# Patient Record
Sex: Male | Born: 1960 | Race: White | Hispanic: No | Smoking: Current every day smoker
Health system: Southern US, Community
[De-identification: ages and names within clinical notes are randomized; demographics above are authoritative.]

## PROBLEM LIST (undated history)

## (undated) DIAGNOSIS — M542 Cervicalgia: Secondary | ICD-10-CM

## (undated) DIAGNOSIS — A6000 Herpesviral infection of urogenital system, unspecified: Secondary | ICD-10-CM

## (undated) DIAGNOSIS — J3501 Chronic tonsillitis: Secondary | ICD-10-CM

## (undated) DIAGNOSIS — M7580 Other shoulder lesions, unspecified shoulder: Secondary | ICD-10-CM

## (undated) DIAGNOSIS — M1990 Unspecified osteoarthritis, unspecified site: Secondary | ICD-10-CM

## (undated) DIAGNOSIS — N200 Calculus of kidney: Secondary | ICD-10-CM

## (undated) DIAGNOSIS — I639 Cerebral infarction, unspecified: Secondary | ICD-10-CM

## (undated) DIAGNOSIS — G459 Transient cerebral ischemic attack, unspecified: Secondary | ICD-10-CM

## (undated) HISTORY — DX: Transient cerebral ischemic attack, unspecified: G45.9

## (undated) HISTORY — DX: Calculus of kidney: N20.0

## (undated) HISTORY — PX: HERNIA REPAIR: SHX51

## (undated) HISTORY — DX: Other shoulder lesions, unspecified shoulder: M75.80

## (undated) HISTORY — PX: WISDOM TOOTH EXTRACTION: SHX21

## (undated) HISTORY — DX: Cerebral infarction, unspecified: I63.9

---

## 2006-11-12 ENCOUNTER — Encounter: Admission: RE | Admit: 2006-11-12 | Discharge: 2006-11-12 | Payer: Self-pay | Admitting: Orthopaedic Surgery

## 2007-03-08 ENCOUNTER — Emergency Department: Payer: Self-pay | Admitting: Emergency Medicine

## 2011-03-14 ENCOUNTER — Ambulatory Visit: Payer: Self-pay | Admitting: Surgery

## 2012-02-05 IMAGING — CT CT ABD-PELV W/ CM
1 of 4 series · 15 of 32 positions shown, 19 images · non-contrast
Comparison: none

REASON FOR EXAM: recurrent hernia Right side
COMMENTS:

PROCEDURE:     CT  - CT ABDOMEN / PELVIS  W  - March 14, 2011  [DATE]
RESULT:
TECHNIQUE: Helical 3 mm sections were obtained from the lung bases through
the pubic symphysis.

[Series 2: soft tissue · axial · 0.81mm/px · z∈[-540,-76]mm · 15 of 171 slices shown, 19 images]
[im 8/171  soft-tissue]
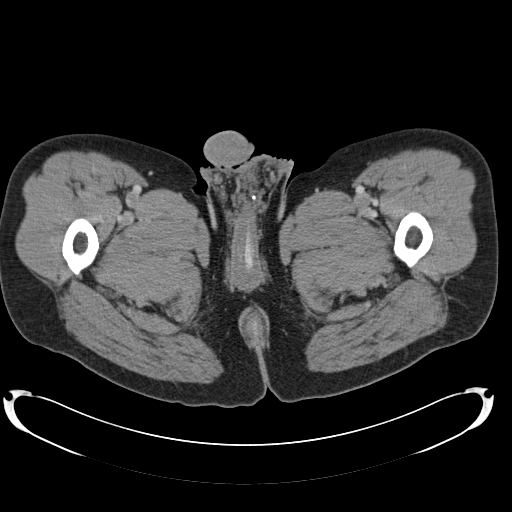
[im 8/171  bone]
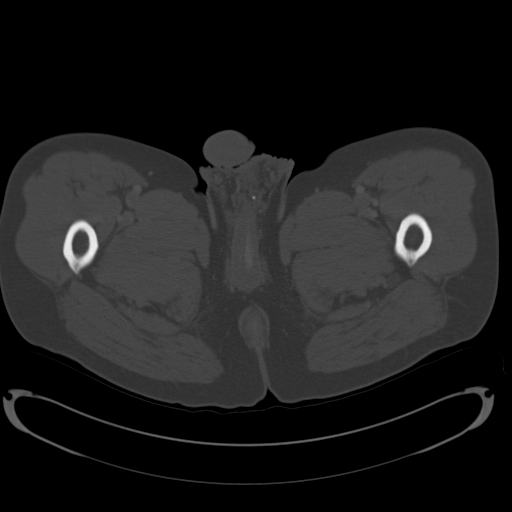
[im 24/171  soft-tissue]
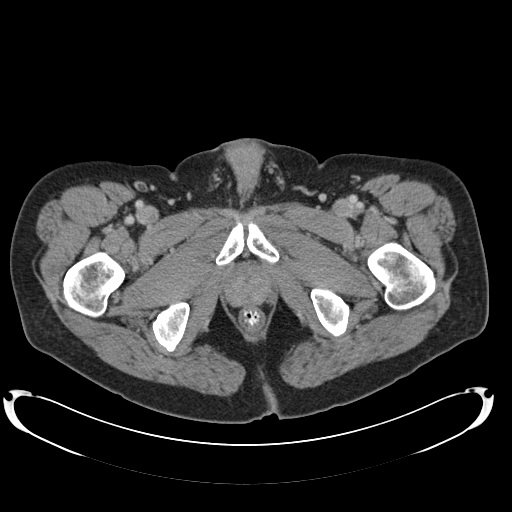
[im 39/171  soft-tissue]
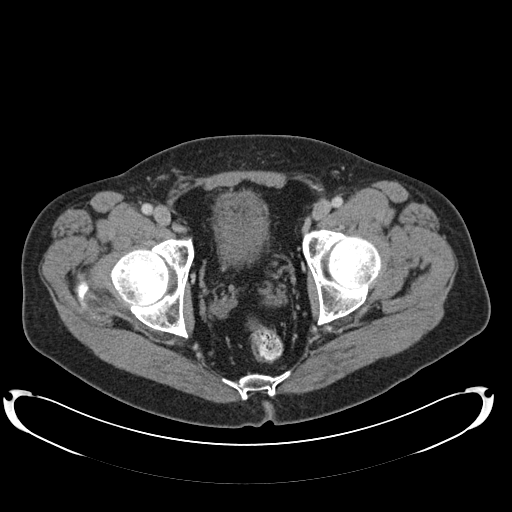
[im 47/171  soft-tissue]
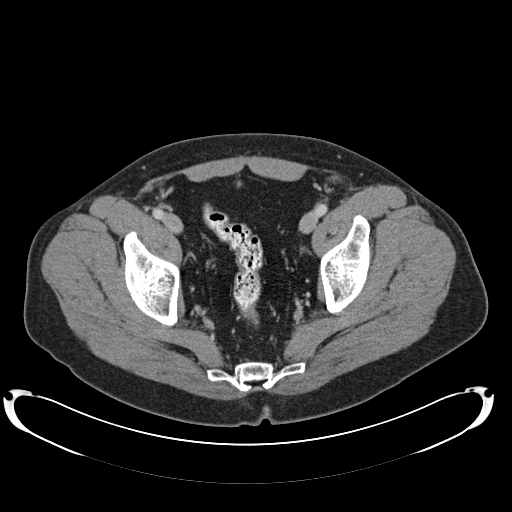
[im 62/171  soft-tissue]
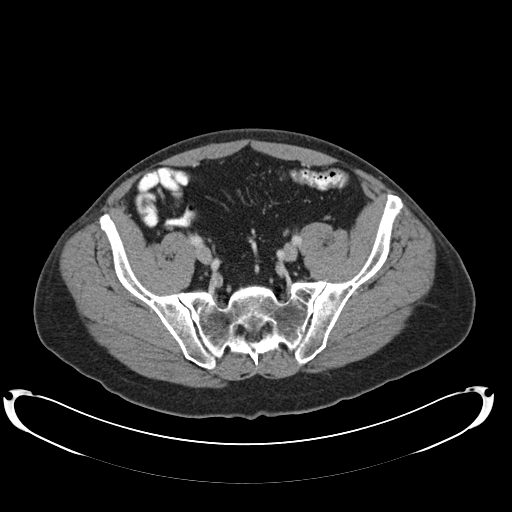
[im 70/171  soft-tissue]
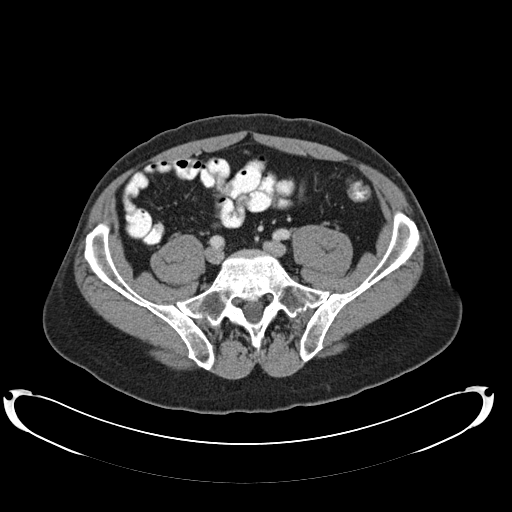
[im 86/171  soft-tissue]
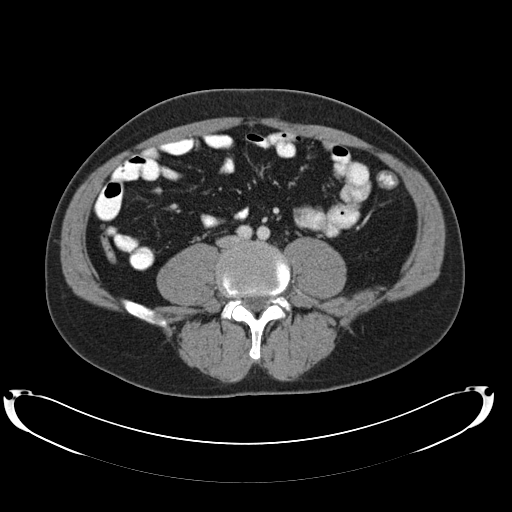
[im 101/171  soft-tissue]
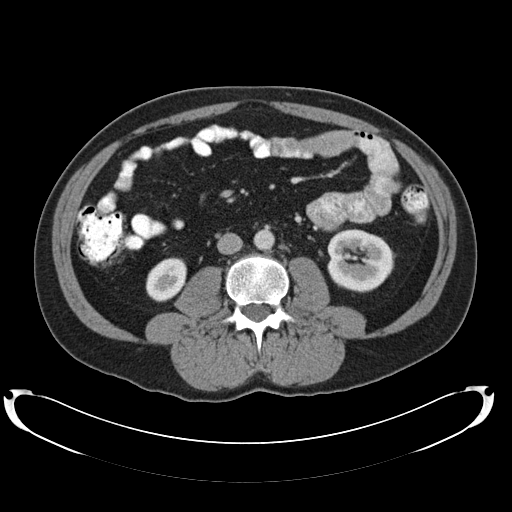
[im 109/171  soft-tissue]
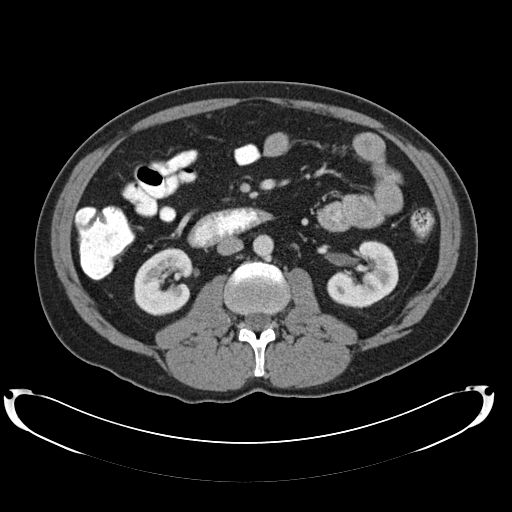
[im 109/171  bone]
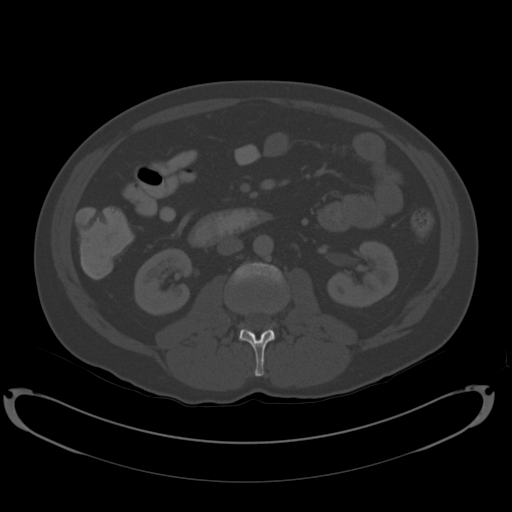
[im 124/171  soft-tissue]
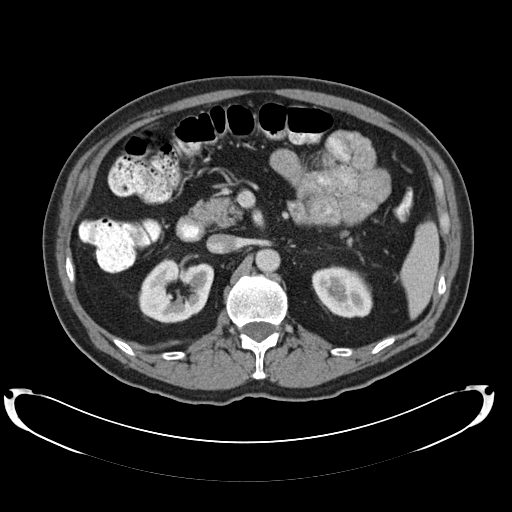
[im 132/171  soft-tissue]
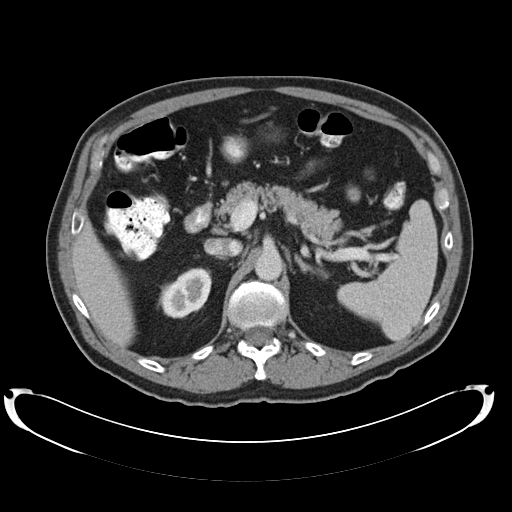
[im 140/171  lung]
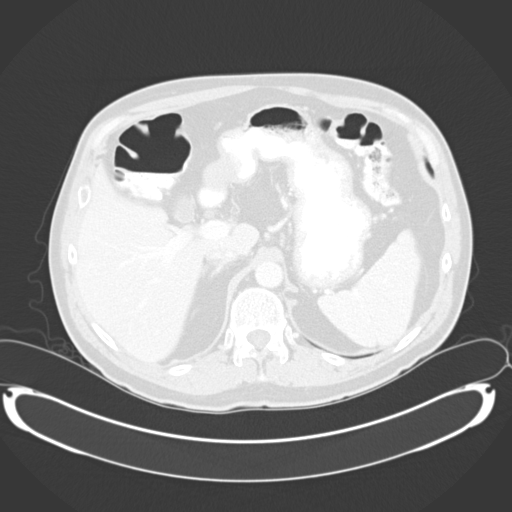
[im 147/171  soft-tissue]
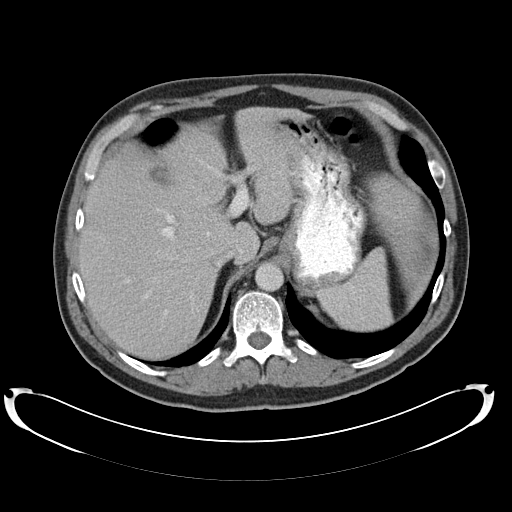
[im 147/171  lung]
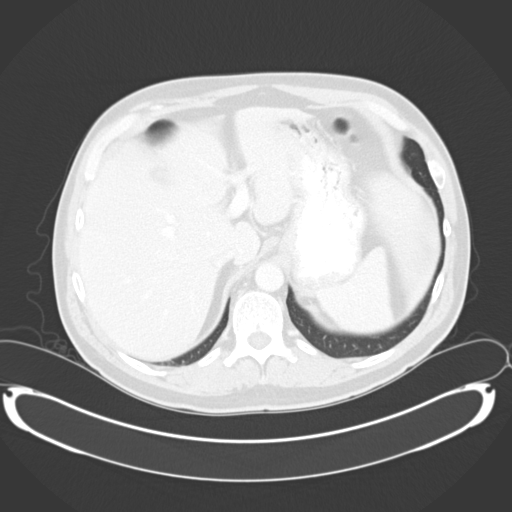
[im 155/171  lung]
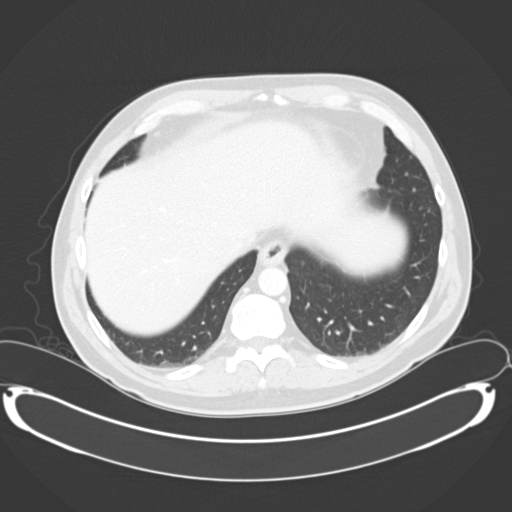
[im 163/171  soft-tissue]
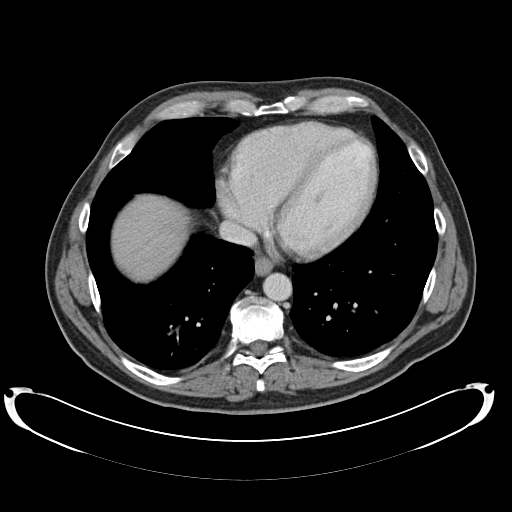
[im 163/171  lung]
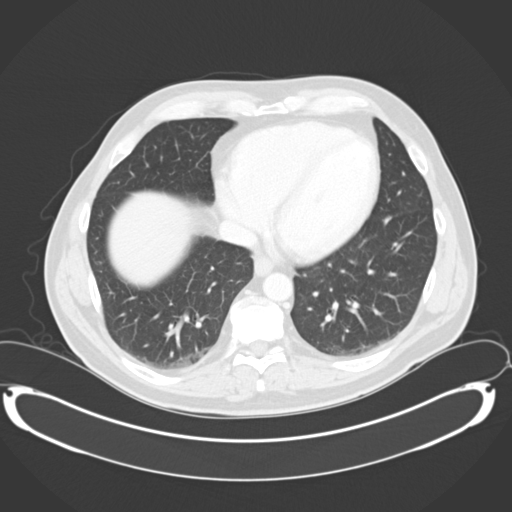

[15 of 32 positions shown; findings below may reference images not displayed]

FINDINGS: Hypoventilation is appreciated within the lung bases.

The liver, spleen, adrenals, and pancreas is unremarkable. The kidneys
demonstrate no evidence of hydronephrosis, hydroureter or evidence of
nephrolithiasis or ureterolithiasis. There is no evidence of abdominal or
pelvic free fluid, loculated fluid collections, masses or adenopathy. There
is no evidence of an abdominal aortic aneurysm. The celiac, SMA, IMA, and
portal vein are opacified.

There is no evidence of an abdominal wall or inguinal hernia.
IMPRESSION: 1.  No CT evidence of an abdominal wall or inguinal hernia.
2.  Otherwise, no evidence of obstructive or inflammatory abnormalities. The
appendix is identified and is unremarkable.

## 2013-03-10 ENCOUNTER — Emergency Department: Payer: Self-pay | Admitting: Emergency Medicine

## 2013-03-10 LAB — BASIC METABOLIC PANEL
Anion Gap: 4 — ABNORMAL LOW (ref 7–16)
BUN: 10 mg/dL (ref 7–18)
Calcium, Total: 9.1 mg/dL (ref 8.5–10.1)
Creatinine: 1.08 mg/dL (ref 0.60–1.30)
EGFR (African American): >60
EGFR (Non-African Amer.): >60
Osmolality: 275 (ref 275–301)

## 2013-03-10 LAB — CBC
MCHC: 34.1 g/dL (ref 32.0–36.0)
MCV: 81 fL (ref 80–100)
Platelet: 214 10*3/uL (ref 150–440)
RBC: 5.68 10*6/uL (ref 4.40–5.90)
RDW: 13.5 % (ref 11.5–14.5)

## 2013-03-10 LAB — TROPONIN I
Troponin-I: <0.02 ng/mL
Troponin-I: <0.02 ng/mL

## 2013-03-23 ENCOUNTER — Encounter: Payer: Self-pay | Admitting: Cardiovascular Disease

## 2013-03-23 ENCOUNTER — Ambulatory Visit (INDEPENDENT_AMBULATORY_CARE_PROVIDER_SITE_OTHER): Payer: Federal, State, Local not specified - PPO | Admitting: Cardiovascular Disease

## 2013-03-23 VITALS — BP 120/86 | HR 60 | Ht 70.0 in | Wt 195.5 lb

## 2013-03-23 DIAGNOSIS — R079 Chest pain, unspecified: Secondary | ICD-10-CM

## 2013-03-23 DIAGNOSIS — R0602 Shortness of breath: Secondary | ICD-10-CM

## 2013-03-23 DIAGNOSIS — R0789 Other chest pain: Secondary | ICD-10-CM | POA: Insufficient documentation

## 2013-03-23 MED ORDER — ASPIRIN EC 81 MG PO TBEC: 81.0000 mg | DELAYED_RELEASE_TABLET | Freq: Every day | ORAL | Status: DC

## 2013-03-23 NOTE — Patient Instructions (Addendum)
  Your physician has recommended you make the following change in your medication: START ASPIRIN 81 MG DAILY  Your physician recommends that you schedule a follow-up appointment in: 1 MONTH     [facility] MYOVIEW  Your caregiver has ordered a Stress Test with nuclear imaging. The purpose of this test is to evaluate the blood supply to your heart muscle. This procedure is referred to as a "Non-Invasive Stress Test." This is because other than having an IV started in your vein, nothing is inserted or "invades" your body. Cardiac stress tests are done to find areas of poor blood flow to the heart by determining the extent of coronary artery disease (CAD). Some patients exercise on a treadmill, which naturally increases the blood flow to your heart, while others who are  unable to walk on a treadmill due to physical limitations have a pharmacologic/chemical stress agent called Lexiscan . This medicine will mimic walking on a treadmill by temporarily increasing your coronary blood flow.   Please note: these test may take anywhere between 2-4 hours to complete  PLEASE REPORT TO [facility] MEDICAL MALL ENTRANCE  THE VOLUNTEERS AT THE FIRST DESK WILL DIRECT YOU WHERE TO GO  Date of Procedure:_______________Friday, Aug 29______________________  Arrival Time for Procedure:_________7:15_____________________      PLEASE NOTIFY THE OFFICE AT LEAST 24 HOURS IN ADVANCE IF YOU ARE UNABLE TO KEEP YOUR APPOINTMENT.  [phone] AND  PLEASE NOTIFY NUCLEAR MEDICINE AT [facility] AT LEAST 24 HOURS IN ADVANCE IF YOU ARE UNABLE TO KEEP YOUR APPOINTMENT. [phone]  How to prepare for your Myoview test:  1. Do not eat or drink after midnight 2. No caffeine for 24 hours prior to test 3. No smoking 24 hours prior to test. 4. Your medication may be taken with water.  If your doctor stopped a medication because of this test, do not take that medication. 5. Ladies, please do not wear dresses.  Skirts or pants are appropriate.  Please wear a short sleeve shirt. 6. No perfume, cologne or lotion. 7. Wear comfortable walking shoes. No heels!

## 2013-03-23 NOTE — Assessment & Plan Note (Addendum)
Shawn Wiggins presents today for followup of an episode of chest discomfort. His EKG shows NSR and  possible anterior  wall myocardial infarction and  some ST/T-wave changes in the inferior and lateral leads.  Our best option is to proceed with a Lexiscan Myoivew study.  I strongly advised him to stop smoking. We reviewed the various approaches to smoking cessation. I seen again in a month or so following the stress test.  We will start him on ASA 81 mg a day.   We will see him sooner if the stress test is abnormal.

## 2013-03-23 NOTE — Progress Notes (Signed)
     Gardenia Phlegm Date of Birth  [birth year]       [facility] Office [address]  [address] [place], [place]  [address]   [place], [place]  [address] [phone]     [phone]   Fax  [phone]    Fax [phone]  Problem List: 1.  chest discomfort 2.  History of Present Illness:  Shawn Wiggins is a 70 who  Seen today for some chest discomfort.  He complains of stinging in his chest. The pain started spontaneously. It lasted for several hours. It was centered in his chest and felt like a stinging sensation. It eventually resolved when he went to sleep.  He went to the emergency room the following day and eventually felt better later that night.  Cardiac enzymes were negative x2. His chest x-ray was unremarkable.  Since then he has felt ok.  He has had occaional heart flutters.  He has not been exercising as much as he would like.  He works as a Curator for [employer].  He still smokes 1 1/2 packs of cigarettes.  Wants to quit.  No current outpatient prescriptions on file prior to visit.   No current facility-administered medications on file prior to visit.    No Known Allergies  Past Medical History  Diagnosis Date  . AC (acromioclavicular) joint bone spurs   . Mini stroke   . Kidney stones     Past Surgical History  Procedure Laterality Date  . Hernia repair      History  Smoking status  . Current Every Day Smoker -- 1.00 packs/day for 30 years  . Types: Cigarettes  Smokeless tobacco  . Not on file    History  Alcohol Use No    Family History  Problem Relation Age of Onset  . Family history unknown: Yes    Reviw of Systems:  Reviewed in the HPI.  All other systems are negative.  Physical Exam: Blood pressure 120/86, pulse 60, height 5\' 10"  (1.778 m), weight 195 lb 8 oz (88.678 kg). General: Well developed, well nourished, in no acute distress.  Head: Normocephalic, atraumatic, sclera non-icteric, mucus  membranes are moist,   Neck: Supple. Carotids are 2 + without bruits. No JVD   Lungs: Clear   Heart: RR, normal S1, S2. I  Abdomen: Soft, non-tender, non-distended with normal bowel sounds.  Msk:  Strength and tone are normal   Extremities: No clubbing or cyanosis. No edema.  Distal pedal pulses are 2+ and equal    Neuro: CN II - XII intact.  Alert and oriented X 3.   Psych:  Normal   ECG: March 23, 2013:  NSR at 63, poor R wave progression, possible anterior MI , Inferior and lateral ST/T abnormalities.  Assessment / Plan:

## 2013-03-25 ENCOUNTER — Ambulatory Visit: Payer: Self-pay

## 2013-03-25 DIAGNOSIS — R079 Chest pain, unspecified: Secondary | ICD-10-CM

## 2013-03-29 ENCOUNTER — Other Ambulatory Visit: Payer: Self-pay

## 2013-03-29 DIAGNOSIS — R079 Chest pain, unspecified: Secondary | ICD-10-CM

## 2013-03-29 DIAGNOSIS — R0602 Shortness of breath: Secondary | ICD-10-CM

## 2013-03-31 ENCOUNTER — Telehealth: Payer: Self-pay

## 2013-03-31 NOTE — Telephone Encounter (Signed)
Message copied by Marilynne Halsted on Thu Mar 31, 2013  4:39 PM ------      Message from: Vesta Mixer      Created: Tue Mar 29, 2013 12:06 PM       Low risk myoview study.      Keep apt. ------

## 2013-03-31 NOTE — Telephone Encounter (Signed)
Spoke w/ pt.  He is aware of results and will keep appt on 04/28/13.

## 2013-04-01 ENCOUNTER — Ambulatory Visit: Payer: Self-pay | Admitting: Cardiovascular Disease

## 2013-04-28 ENCOUNTER — Ambulatory Visit (INDEPENDENT_AMBULATORY_CARE_PROVIDER_SITE_OTHER): Payer: Federal, State, Local not specified - PPO | Admitting: Cardiovascular Disease

## 2013-04-28 ENCOUNTER — Encounter: Payer: Self-pay | Admitting: Cardiovascular Disease

## 2013-04-28 VITALS — BP 126/86 | HR 77 | Ht 70.0 in | Wt 198.0 lb

## 2013-04-28 DIAGNOSIS — R0789 Other chest pain: Secondary | ICD-10-CM

## 2013-04-28 NOTE — Assessment & Plan Note (Addendum)
He is doing well.  He is interested in quitting smoking.  We discussed the smoking cessation classes at [facility] and he wrote the number down.  We will not schedule him for a return apt but will be glad to see him if needed.

## 2013-04-28 NOTE — Progress Notes (Signed)
     Gardenia Phlegm Date of Birth  February 09, 1961       [facility] Office [address]  [address] [place], [place]  [address]   [place], [place]  [address] [phone]     [phone]   Fax  [phone]    Fax [phone]  Problem List: 1.  chest discomfort 2.  History of Present Illness:  Kavontae is a 89 who  Seen today for some chest discomfort.  He complains of stinging in his chest. The pain started spontaneously. It lasted for several hours. It was centered in his chest and felt like a stinging sensation. It eventually resolved when he went to sleep.  He went to the emergency room the following day and eventually felt better later that night.  Cardiac enzymes were negative x2. His chest x-ray was unremarkable.  Since then he has felt ok.  He has had occaional heart flutters.  He has not been exercising as much as he would like.  He works as a Curator for [employer].  He still smokes 1 1/2 packs of cigarettes.  Wants to quit.  Oct. 2, 2014:  Eleanor is doing ok.. he presented with some episodes of chest discomfort. His stress Myoview study performed at [facility] was low risk.  He is no longer having any chest pain.  He is still smoking.  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 .    Current Outpatient Prescriptions on File Prior to Visit  Medication Sig Dispense Refill  . aspirin EC 81 MG tablet Take 1 tablet (81 mg total) by mouth daily.  90 tablet  3   No current facility-administered medications on file prior to visit.    No Known Allergies  Past Medical History  Diagnosis Date  . AC (acromioclavicular) joint bone spurs   . Mini stroke   . Kidney stones     Past Surgical History  Procedure Laterality Date  . Hernia repair      History  Smoking status  . Current Every Day Smoker -- 1.00 packs/day for 30 years  . Types: Cigarettes  Smokeless tobacco  . Not on file    History  Alcohol Use No    No family history on  file.  Reviw of Systems:  Reviewed in the HPI.  All other systems are negative.  Physical Exam: Blood pressure 126/86, pulse 77, height 5\' 10"  (1.778 m), weight 198 lb (89.812 kg), SpO2 96.00%. General: Well developed, well nourished, in no acute distress.  Head: Normocephalic, atraumatic, sclera non-icteric, mucus membranes are moist,   Neck: Supple. Carotids are 2 + without bruits. No JVD   Lungs: Clear   Heart: RR, normal S1, S2. I  Abdomen: Soft, non-tender, non-distended with normal bowel sounds.  Msk:  Strength and tone are normal   Extremities: No clubbing or cyanosis. No edema.  Distal pedal pulses are 2+ and equal    Neuro: CN II - XII intact.  Alert and oriented X 3.   Psych:  Normal   ECG: March 23, 2013:  NSR at 63, poor R wave progression, possible anterior MI , Inferior and lateral ST/T abnormalities.  Assessment / Plan:

## 2013-04-28 NOTE — Patient Instructions (Addendum)
Your physician recommends that you schedule a follow-up appointment in: AS NEEDED BASIS  

## 2013-05-31 ENCOUNTER — Encounter: Payer: Self-pay | Admitting: *Deleted

## 2014-02-01 IMAGING — CR DG CHEST 2V
1 series · 2 of 2 positions shown · non-contrast
Comparison: none

REASON FOR EXAM: Chest Pain
COMMENTS:

PROCEDURE:     DXR - DXR CHEST PA (OR AP) AND LATERAL  - March 10, 2013  [DATE]
RESULT:     Comparison: None

[Series 1: w chest pa · 0.14mm/px · 2 of 2 slices shown]
[im 1/2]
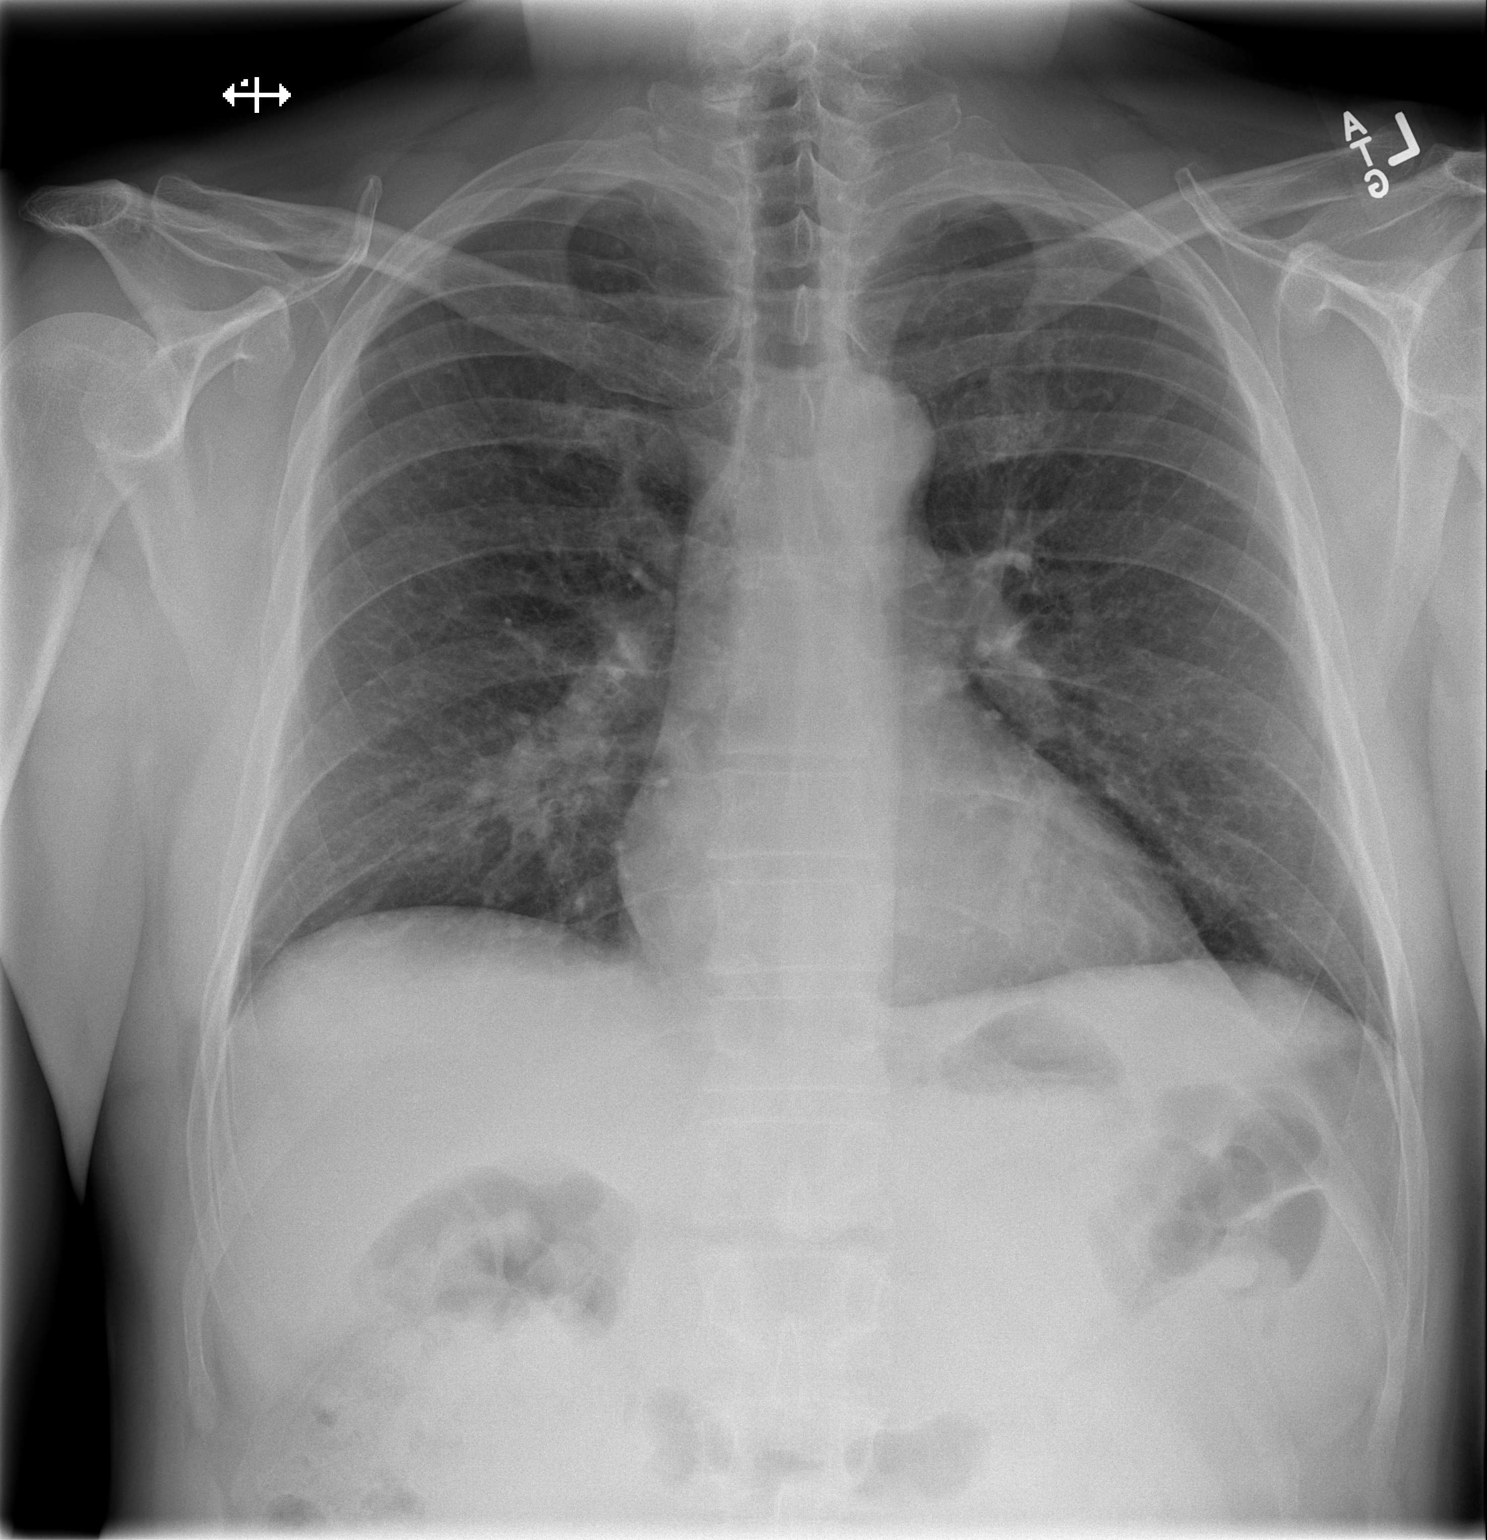
[im 2/2]
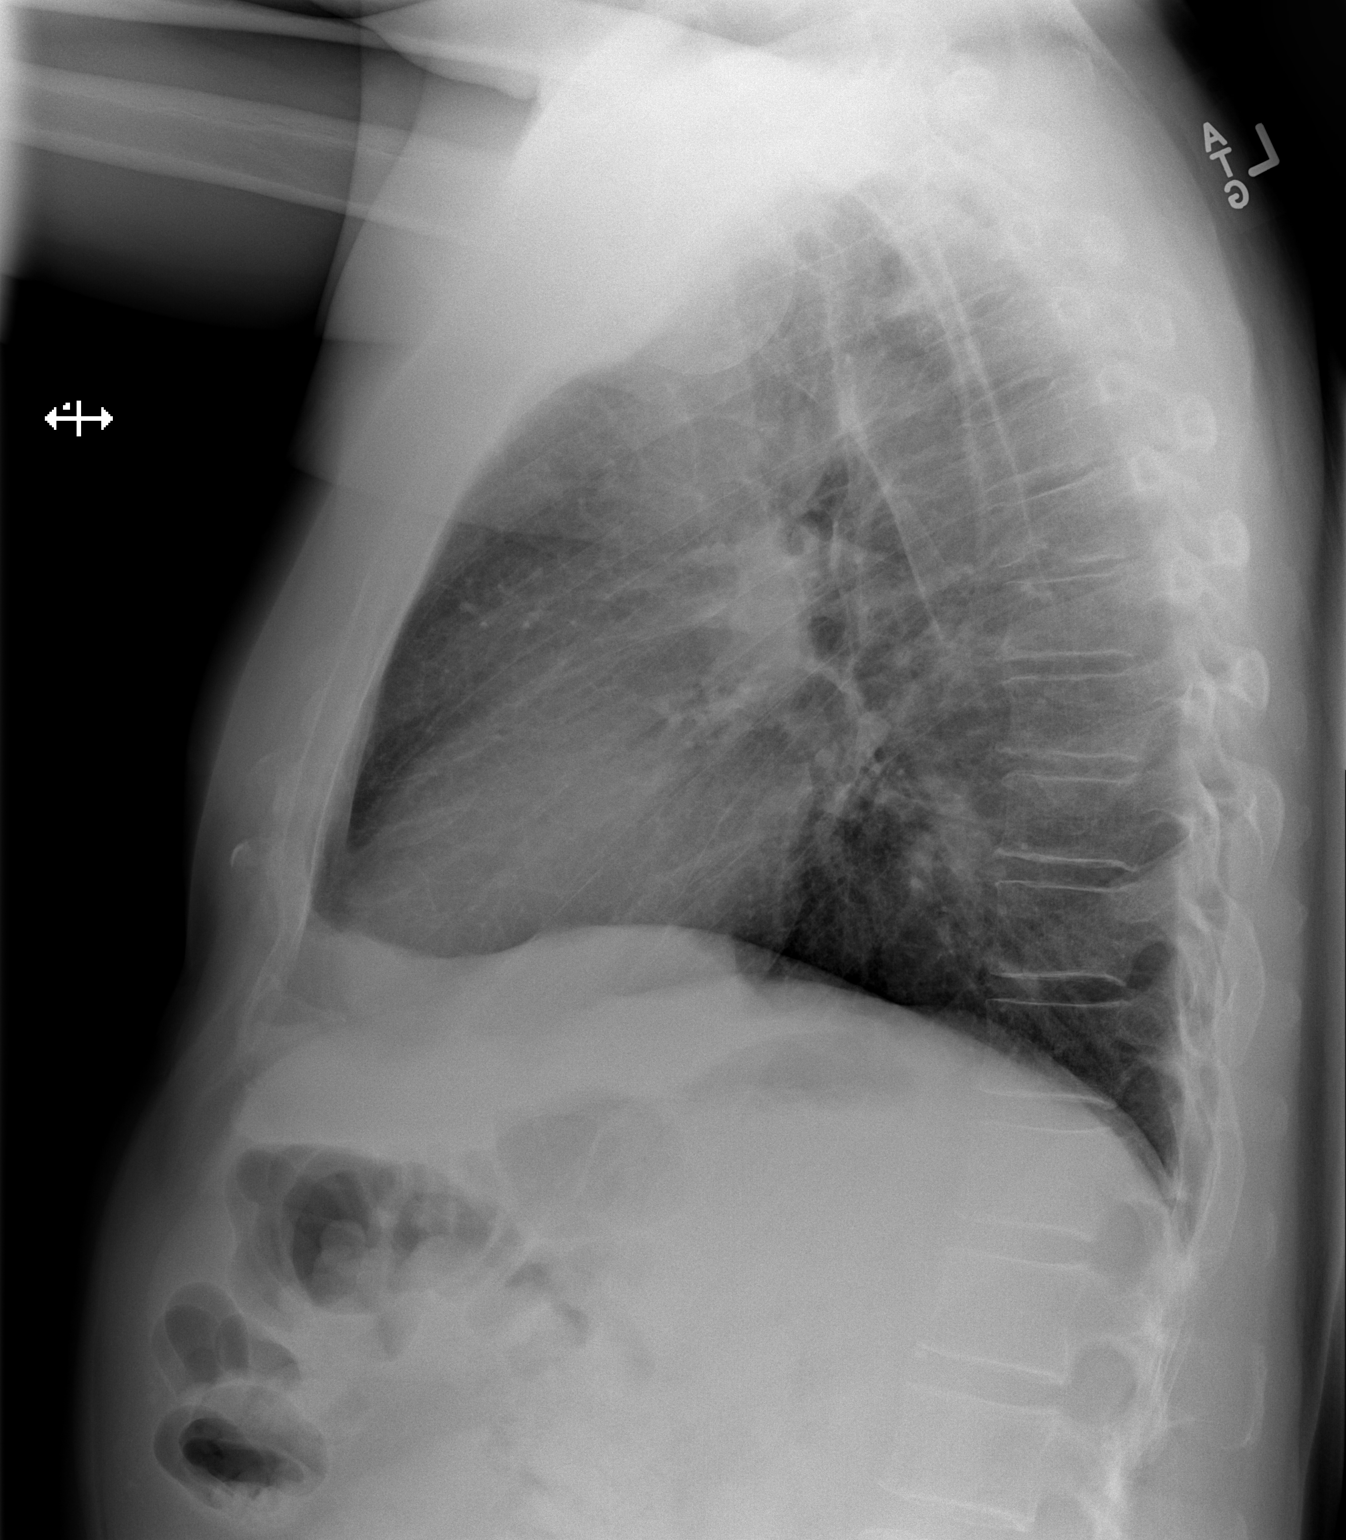

[2 of 2 positions shown; findings below may reference images not displayed]

FINDINGS: PA and lateral chest radiographs are provided.  There is no focal
parenchymal opacity, pleural effusion, or pneumothorax. The heart and
mediastinum are unremarkable.  The osseous structures are unremarkable.
IMPRESSION: No acute disease of the che[REDACTED]

## 2015-03-21 ENCOUNTER — Encounter: Payer: Self-pay | Admitting: *Deleted

## 2015-03-22 ENCOUNTER — Ambulatory Visit: Payer: Federal, State, Local not specified - PPO | Admitting: Anesthesiology

## 2015-03-22 ENCOUNTER — Encounter: Payer: Self-pay | Admitting: *Deleted

## 2015-03-22 ENCOUNTER — Encounter
Admission: RE | Disposition: A | Discharge status: Home / Self Care | Payer: Self-pay | Source: Ambulatory Visit | Attending: Gastroenterology

## 2015-03-22 ENCOUNTER — Ambulatory Visit
Admission: RE | Admit: 2015-03-22 | Discharge: 2015-03-22 | Disposition: A | Discharge status: Home / Self Care | Payer: Federal, State, Local not specified - PPO | Source: Ambulatory Visit | Attending: Gastroenterology | Admitting: Gastroenterology

## 2015-03-22 DIAGNOSIS — Z87442 Personal history of urinary calculi: Secondary | ICD-10-CM | POA: Insufficient documentation

## 2015-03-22 DIAGNOSIS — Z8673 Personal history of transient ischemic attack (TIA), and cerebral infarction without residual deficits: Secondary | ICD-10-CM | POA: Insufficient documentation

## 2015-03-22 DIAGNOSIS — D127 Benign neoplasm of rectosigmoid junction: Secondary | ICD-10-CM | POA: Insufficient documentation

## 2015-03-22 DIAGNOSIS — Z79899 Other long term (current) drug therapy: Secondary | ICD-10-CM | POA: Insufficient documentation

## 2015-03-22 DIAGNOSIS — Z7982 Long term (current) use of aspirin: Secondary | ICD-10-CM | POA: Insufficient documentation

## 2015-03-22 DIAGNOSIS — D123 Benign neoplasm of transverse colon: Secondary | ICD-10-CM | POA: Insufficient documentation

## 2015-03-22 DIAGNOSIS — K640 First degree hemorrhoids: Secondary | ICD-10-CM | POA: Insufficient documentation

## 2015-03-22 DIAGNOSIS — Z1211 Encounter for screening for malignant neoplasm of colon: Secondary | ICD-10-CM | POA: Diagnosis present

## 2015-03-22 DIAGNOSIS — K621 Rectal polyp: Secondary | ICD-10-CM | POA: Diagnosis not present

## 2015-03-22 DIAGNOSIS — D125 Benign neoplasm of sigmoid colon: Secondary | ICD-10-CM | POA: Diagnosis not present

## 2015-03-22 DIAGNOSIS — F17210 Nicotine dependence, cigarettes, uncomplicated: Secondary | ICD-10-CM | POA: Insufficient documentation

## 2015-03-22 DIAGNOSIS — M7580 Other shoulder lesions, unspecified shoulder: Secondary | ICD-10-CM | POA: Diagnosis not present

## 2015-03-22 HISTORY — PX: COLONOSCOPY WITH PROPOFOL: SHX5780

## 2015-03-22 HISTORY — DX: Herpesviral infection of urogenital system, unspecified: A60.00

## 2015-03-22 SURGERY — COLONOSCOPY WITH PROPOFOL
Anesthesia: General

## 2015-03-22 MED ORDER — SODIUM CHLORIDE 0.9 % IV SOLN
INTRAVENOUS | Status: DC
  Administered 2015-03-22: 1000 mL via INTRAVENOUS
  Administered 2015-03-22: 12:00:00 via INTRAVENOUS

## 2015-03-22 MED ORDER — PHENYLEPHRINE HCL 10 MG/ML IJ SOLN
INTRAMUSCULAR | Status: DC | PRN
  Administered 2015-03-22: 100 ug via INTRAVENOUS

## 2015-03-22 MED ORDER — LIDOCAINE HCL (CARDIAC) 20 MG/ML IV SOLN
INTRAVENOUS | Status: DC | PRN
  Administered 2015-03-22: 60 mg via INTRAVENOUS

## 2015-03-22 MED ORDER — PROPOFOL INFUSION 10 MG/ML OPTIME
INTRAVENOUS | Status: DC | PRN
  Administered 2015-03-22: 200 ug/kg/min via INTRAVENOUS

## 2015-03-22 MED ORDER — PROPOFOL 10 MG/ML IV BOLUS
INTRAVENOUS | Status: DC | PRN
  Administered 2015-03-22: 60 mg via INTRAVENOUS

## 2015-03-22 MED ORDER — SODIUM CHLORIDE 0.9 % IV SOLN: INTRAVENOUS | Status: DC

## 2015-03-22 NOTE — H&P (Signed)
  Primary Care Physician:  Jae Dire, MD  Pre-Procedure History & Physical: HPI:  Shawn Wiggins is a 54 y.o. male is here for an colonoscopy.   Past Medical History  Diagnosis Date  . AC (acromioclavicular) joint bone spurs   . Mini stroke   . Kidney stones   . Herpes genitalis     Past Surgical History  Procedure Laterality Date  . Hernia repair    . Hernia repair      Prior to Admission medications   Medication Sig Start Date End Date Taking? Authorizing Provider  cyclobenzaprine (FLEXERIL) 10 MG tablet Take 10 mg by mouth 3 (three) times daily as needed for muscle spasms.   Yes Historical Provider, MD  fexofenadine (ALLEGRA) 180 MG tablet Take 180 mg by mouth daily.   Yes Historical Provider, MD  HYDROcodone-acetaminophen (NORCO/VICODIN) 5-325 MG per tablet Take 1 tablet by mouth every 4 (four) hours as needed for moderate pain.   Yes Historical Provider, MD  naproxen (NAPROSYN) 500 MG tablet Take 500 mg by mouth 2 (two) times daily with a meal.   Yes Historical Provider, MD  aspirin EC 81 MG tablet Take 1 tablet (81 mg total) by mouth daily. 03/23/13   Thayer Headings, MD    Allergies as of 02/27/2015  . (No Known Allergies)    History reviewed. No pertinent family history.  Social History   Social History  . Marital Status: Married    Spouse Name: N/A  . Number of Children: N/A  . Years of Education: N/A   Occupational History  . Not on file.   Social History Main Topics  . Smoking status: Current Every Day Smoker -- 1.00 packs/day for 30 years    Types: Cigarettes  . Smokeless tobacco: Not on file  . Alcohol Use: No  . Drug Use: No     Comment: PAST  . Sexual Activity: Not on file   Other Topics Concern  . Not on file   Social History Narrative     Physical Exam: BP 98/83 mmHg  Pulse 71  Temp(Src) 96.7 F (35.9 C) (Tympanic)  Resp 19  Ht 5\' 10"  (1.778 m)  Wt 88.451 kg (195 lb)  BMI 27.98 kg/m2  SpO2 99% General:   Alert,  pleasant  and cooperative in NAD Head:  Normocephalic and atraumatic. Neck:  Supple; no masses or thyromegaly. Lungs:  Clear throughout to auscultation.    Heart:  Regular rate and rhythm. Abdomen:  Soft, nontender and nondistended. Normal bowel sounds, without guarding, and without rebound.   Neurologic:  Alert and  oriented x4;  grossly normal neurologically.  Impression/Plan: Shawn Wiggins is here for an colonoscopy to be performed for screening  Risks, benefits, limitations, and alternatives regarding  colonoscopy have been reviewed with the patient.  Questions have been answered.  All parties agreeable.   Josefine Class, MD  03/22/2015, 11:26 AM

## 2015-03-22 NOTE — Discharge Instructions (Signed)

## 2015-03-22 NOTE — OR Nursing (Signed)
2 Clips was applied to recto-sigmoid polyp.

## 2015-03-22 NOTE — Anesthesia Preprocedure Evaluation (Signed)
Anesthesia Evaluation  Patient identified by MRN, date of birth, ID band Patient awake    Reviewed: Allergy & Precautions, NPO status , Patient's Chart, lab work & pertinent test results, reviewed documented beta blocker date and time   Airway Mallampati: II  TM Distance: >3 FB     Dental  (+) Chipped   Pulmonary Current Smoker,          Cardiovascular     Neuro/Psych    GI/Hepatic   Endo/Other    Renal/GU Renal InsufficiencyRenal disease     Musculoskeletal   Abdominal   Peds  Hematology   Anesthesia Other Findings Hx of ministrokes.  Reproductive/Obstetrics                             Anesthesia Physical Anesthesia Plan  ASA: III  Anesthesia Plan: General   Post-op Pain Management:    Induction:   Airway Management Planned: Nasal Cannula  Additional Equipment:   Intra-op Plan:   Post-operative Plan:   Informed Consent: I have reviewed the patients History and Physical, chart, labs and discussed the procedure including the risks, benefits and alternatives for the proposed anesthesia with the patient or authorized representative who has indicated his/her understanding and acceptance.     Plan Discussed with: CRNA  Anesthesia Plan Comments:         Anesthesia Quick Evaluation

## 2015-03-22 NOTE — Op Note (Signed)
[facility] Gastroenterology Patient Name: Shawn Wiggins Procedure Date: 03/22/2015 11:27 AM MRN: [id number] Account #: [id number] Date of Birth: Dec 11, 1960 Admit Type: Outpatient Age: 54 Room: [facility] ENDO ROOM 1 Gender: Male Note Status: Finalized Procedure:         Colonoscopy Indications:       Screening for colorectal malignant neoplasm, This is the                     patient's first colonoscopy Patient Profile:   This is a 54 year old male. Providers:         Gerrit Heck. Rayann Heman, MD Referring MD:      Dr Calla Kicks H. Eliberto Ivory, MD (Referring MD) Medicines:         Propofol per Anesthesia Complications:     No immediate complications. Procedure:         Pre-Anesthesia Assessment:                    - Prior to the procedure, a History and Physical was                     performed, and patient medications, allergies and                     sensitivities were reviewed. The patient's tolerance of                     previous anesthesia was reviewed.                    After obtaining informed consent, the colonoscope was                     passed under direct vision. Throughout the procedure, the                     patient's blood pressure, pulse, and oxygen saturations                     were monitored continuously. The Colonoscope was                     introduced through the anus and advanced to the the cecum,                     identified by appendiceal orifice and ileocecal valve. The                     colonoscopy was performed without difficulty. The patient                     tolerated the procedure well. The quality of the bowel                     preparation was excellent. Findings:      A 6 mm polyp was found in the transverse colon. The polyp was       semi-pedunculated. The polyp was removed with a hot snare. Resection and       retrieval were complete.      A 4 mm polyp was found in the sigmoid colon at 35 cm. The polyp was    sessile. The polyp was removed with  a cold snare. Resection and       retrieval were complete.      A 10 mm polyp was found in the recto-sigmoid colon at 16 cm. The polyp       was pedunculated. The polyp was removed with a hot snare. Resection and       retrieval were complete. To prevent bleeding after the polypectomy, two       hemostatic clips were successfully placed. There was no bleeding during,       and at the end, of the procedure.      A 2 mm benign appearing polyp was found in the rectum. The polyp was       sessile. The polyp was removed with a cold snare. Resection and       retrieval were complete.      Internal hemorrhoids were found during retroflexion. The hemorrhoids       were Grade I (internal hemorrhoids that do not prolapse).      The exam was otherwise without abnormality. Impression:        - One 6 mm polyp in the transverse colon. Resected and                     retrieved.                    - One 4 mm polyp in the sigmoid colon. Resected and                     retrieved.                    - One 10 mm polyp at the recto-sigmoid colon. Resected and                     retrieved. Clips were placed.                    - One benign appearing 2 mm polyp in the rectum. Resected                     and retrieved.                    - Internal hemorrhoids.                    - The examination was otherwise normal. Recommendation:    - Observe patient in GI recovery unit.                    - Continue present medications.                    - Await pathology results.                    - Repeat colonoscopy for surveillance based on pathology                     results.                    - Return to referring physician.                    - The findings and recommendations were discussed with the  patient.                    - The findings and recommendations were discussed with the                     patient's family. Procedure Code(s): ---  Professional ---                    [phone], Colonoscopy, flexible; with removal of tumor(s),                     polyp(s), or other lesion(s) by snare technique CPT copyright 2014 American Medical Association. All rights reserved. The codes documented in this report are preliminary and upon coder review may  be revised to meet current compliance requirements. Mellody Life, MD 03/22/2015 12:12:13 PM This report has been signed electronically. Number of Addenda: 0 Note Initiated On: 03/22/2015 11:27 AM Scope Withdrawal Time: 0 hours 23 minutes 10 seconds  Total Procedure Duration: 0 hours 26 minutes 9 seconds       [facility]

## 2015-03-22 NOTE — Transfer of Care (Signed)
Immediate Anesthesia Transfer of Care Note  Patient: Shawn Wiggins  Procedure(s) Performed: Procedure(s): COLONOSCOPY WITH PROPOFOL (N/A)  Patient Location: PACU  Anesthesia Type:General  Level of Consciousness: awake, alert  and oriented  Airway & Oxygen Therapy: Patient Spontanous Breathing and Patient connected to nasal cannula oxygen  Post-op Assessment: Report given to RN and Post -op Vital signs reviewed and stable  Post vital signs: Reviewed and stable  Last Vitals:  Filed Vitals:   03/22/15 1215  BP: 107/77  Pulse: 60  Temp: 36.2 C  Resp: 13    Complications: No apparent anesthesia complications

## 2015-03-23 LAB — SURGICAL PATHOLOGY

## 2015-03-23 NOTE — Anesthesia Postprocedure Evaluation (Signed)
  Anesthesia Post-op Note  Patient: Shawn Wiggins  Procedure(s) Performed: Procedure(s): COLONOSCOPY WITH PROPOFOL (N/A)  Anesthesia type:General  Patient location: PACU  Post pain: Pain level controlled  Post assessment: Post-op Vital signs reviewed, Patient's Cardiovascular Status Stable, Respiratory Function Stable, Patent Airway and No signs of Nausea or vomiting  Post vital signs: Reviewed and stable  Last Vitals:  Filed Vitals:   03/22/15 1240  BP: 112/83  Pulse: 66  Temp:   Resp: 13    Level of consciousness: awake, alert  and patient cooperative  Complications: No apparent anesthesia complications

## 2015-11-07 NOTE — Discharge Instructions (Signed)
T & A INSTRUCTION SHEET - [facility] °[facility] ° °CREIGHTON VAUGHT, MD °PAUL H. JUENGEL, MD  °P. SCOTT BENNETT °CHAPMAN MCQUEEN, MD ° °[address] [place], [place] [address] TEL. [phone] °[address] [place] [address] [phone] ° °INFORMATION SHEET FOR A TONSILLECTOMY AND ADENDOIDECTOMY ° °About Your Tonsils and Adenoids ° The tonsils and adenoids are normal body tissues that are part of our immune system.  They normally help to protect us against diseases that may enter our mouth and nose.  However, sometimes the tonsils and/or adenoids become too large and obstruct our breathing, especially at night. °  ° If either of these things happen it helps to remove the tonsils and adenoids in order to become healthier. The operation to remove the tonsils and adenoids is called a tonsillectomy and adenoidectomy. ° °The Location of Your Tonsils and Adenoids ° The tonsils are located in the back of the throat on both side and sit in a cradle of muscles. The adenoids are located in the roof of the mouth, behind the nose, and closely associated with the opening of the Eustachian tube to the ear. ° °Surgery on Tonsils and Adenoids ° A tonsillectomy and adenoidectomy is a short operation which takes about thirty minutes.  This includes being put to sleep and being awakened.  Tonsillectomies and adenoidectomies are performed at [facility] and may require observation period in the recovery room prior to going home. ° °Following the Operation for a Tonsillectomy ° A cautery machine is used to control bleeding.  Bleeding from a tonsillectomy and adenoidectomy is minimal and postoperatively the risk of bleeding is approximately four percent, although this rarely life threatening. ° ° ° °After your tonsillectomy and adenoidectomy post-op care at home: ° °1. Our patients are able to go home the same day.  You may be given prescriptions for pain  medications and antibiotics, if indicated. °2. It is extremely important to remember that fluid intake is of utmost importance after a tonsillectomy.  The amount that you drink must be maintained in the postoperative period.  A good indication of whether a child is getting enough fluid is whether his/her urine output is constant.  As long as children are urinating or wetting their diaper every 6 - 8 hours this is usually enough fluid intake.   °3. Although rare, this is a risk of some bleeding in the first ten days after surgery.  This is usually occurs between day five and nine postoperatively.  This risk of bleeding is approximately four percent.  If you or your child should have any bleeding you should remain calm and notify our office or go directly to the Emergency Room at [facility] where they will contact us. Our doctors are available seven days a week for notification.  We recommend sitting up quietly in a chair, place an ice pack on the front of the neck and spitting out the blood gently until we are able to contact you.  Adults should gargle gently with ice water and this may help stop the bleeding.  If the bleeding does not stop after a short time, i.e. 10 to 15 minutes, or seems to be increasing again, please contact us or go to the hospital.   °4. It is common for the pain to be worse at 5 - 7 days postoperatively.  This occurs because the “scab” is peeling off and the mucous membrane (skin of   the throat) is growing back where the tonsils were.   °5. It is common for a low-grade fever, less than 102, during the first week after a tonsillectomy and adenoidectomy.  It is usually due to not drinking enough liquids, and we suggest your use liquid Tylenol or the pain medicine with Tylenol prescribed in order to keep your temperature below 102.  Please follow the directions on the back of the bottle. °6. Do not take aspirin or any products that contain aspirin such as Bufferin, Anacin,  Ecotrin, aspirin gum, Goodies, BC headache powders, etc., after a T&A because it can promote bleeding.  Please check with our office before administering any other medication that may been prescribed by other doctors during the two week post-operative period. °7. If you happen to look in the mirror or into your child’s mouth you will see white/gray patches on the back of the throat.  This is what a scab looks like in the mouth and is normal after having a T&A.  It will disappear once the tonsil area heals completely. However, it may cause a noticeable odor, and this too will disappear with time.     °8. You or your child may experience ear pain after having a T&A.  This is called referred pain and comes from the throat, but it is felt in the ears.  Ear pain is quite common and expected.  It will usually go away after ten days.  There is usually nothing wrong with the ears, and it is primarily due to the healing area stimulating the nerve to the ear that runs along the side of the throat.  Use either the prescribed pain medicine or Tylenol as needed.  °9. The throat tissues after a tonsillectomy are obviously sensitive.  Smoking around children who have had a tonsillectomy significantly increases the risk of bleeding.  DO NOT SMOKE!  ° °General Anesthesia, Adult, Care After °Refer to this sheet in the next few weeks. These instructions provide you with information on caring for yourself after your procedure. Your health care provider may also give you more specific instructions. Your treatment has been planned according to current medical practices, but problems sometimes occur. Call your health care provider if you have any problems or questions after your procedure. °WHAT TO EXPECT AFTER THE PROCEDURE °After the procedure, it is typical to experience: °· Sleepiness. °· Nausea and vomiting. °HOME CARE INSTRUCTIONS °· For the first 24 hours after general anesthesia: °¨ Have a responsible person with you. °¨ Do not  drive a car. If you are alone, do not take public transportation. °¨ Do not drink alcohol. °¨ Do not take medicine that has not been prescribed by your health care provider. °¨ Do not sign important papers or make important decisions. °¨ You may resume a normal diet and activities as directed by your health care provider. °· Change bandages (dressings) as directed. °· If you have questions or problems that seem related to general anesthesia, call the hospital and ask for the anesthetist or anesthesiologist on call. °SEEK MEDICAL CARE IF: °· You have nausea and vomiting that continue the day after anesthesia. °· You develop a rash. °SEEK IMMEDIATE MEDICAL CARE IF:  °· You have difficulty breathing. °· You have chest pain. °· You have any allergic problems. °  °This information is not intended to replace advice given to you by your health care provider. Make sure you discuss any questions you have with your health care provider. °  °Document   Released: 10/20/2000 Document Revised: 08/04/2014 Document Reviewed: 11/12/2011 °Elsevier Interactive Patient Education ©2016 Elsevier Inc. ° °

## 2015-11-08 ENCOUNTER — Encounter
Admission: RE | Disposition: A | Discharge status: Home / Self Care | Payer: Self-pay | Source: Ambulatory Visit | Attending: Otolaryngology

## 2015-11-08 ENCOUNTER — Ambulatory Visit: Payer: Federal, State, Local not specified - PPO | Admitting: Anesthesiology

## 2015-11-08 ENCOUNTER — Ambulatory Visit
Admission: RE | Admit: 2015-11-08 | Discharge: 2015-11-08 | Disposition: A | Discharge status: Home / Self Care | Payer: Federal, State, Local not specified - PPO | Source: Ambulatory Visit | Attending: Otolaryngology | Admitting: Otolaryngology

## 2015-11-08 DIAGNOSIS — F17200 Nicotine dependence, unspecified, uncomplicated: Secondary | ICD-10-CM | POA: Insufficient documentation

## 2015-11-08 DIAGNOSIS — J353 Hypertrophy of tonsils with hypertrophy of adenoids: Secondary | ICD-10-CM | POA: Insufficient documentation

## 2015-11-08 DIAGNOSIS — J3501 Chronic tonsillitis: Secondary | ICD-10-CM | POA: Diagnosis present

## 2015-11-08 HISTORY — DX: Unspecified osteoarthritis, unspecified site: M19.90

## 2015-11-08 HISTORY — DX: Cervicalgia: M54.2

## 2015-11-08 HISTORY — DX: Chronic tonsillitis: J35.01

## 2015-11-08 HISTORY — PX: TONSILLECTOMY: SHX5217

## 2015-11-08 SURGERY — TONSILLECTOMY
Anesthesia: General | Site: Throat | Wound class: Clean Contaminated

## 2015-11-08 MED ORDER — LACTATED RINGERS IV SOLN
INTRAVENOUS | Status: DC
  Administered 2015-11-08: 09:00:00 via INTRAVENOUS

## 2015-11-08 MED ORDER — HYDROMORPHONE HCL 1 MG/ML IJ SOLN: 0.2500 mg | INTRAMUSCULAR | Status: DC | PRN

## 2015-11-08 MED ORDER — OXYCODONE HCL 5 MG PO TABS: 5.0000 mg | ORAL_TABLET | Freq: Once | ORAL | Status: AC | PRN

## 2015-11-08 MED ORDER — FENTANYL CITRATE (PF) 100 MCG/2ML IJ SOLN
INTRAMUSCULAR | Status: DC | PRN
  Administered 2015-11-08 (×2): 100 ug via INTRAVENOUS

## 2015-11-08 MED ORDER — PROPOFOL 10 MG/ML IV BOLUS
INTRAVENOUS | Status: DC | PRN
  Administered 2015-11-08: 200 mg via INTRAVENOUS

## 2015-11-08 MED ORDER — MIDAZOLAM HCL 5 MG/5ML IJ SOLN
INTRAMUSCULAR | Status: DC | PRN
  Administered 2015-11-08: 2 mg via INTRAVENOUS

## 2015-11-08 MED ORDER — OXYCODONE HCL 5 MG/5ML PO SOLN
5.0000 mg | Freq: Once | ORAL | Status: AC | PRN
  Administered 2015-11-08: 5 mg via ORAL

## 2015-11-08 MED ORDER — ONDANSETRON HCL 4 MG/2ML IJ SOLN
INTRAMUSCULAR | Status: DC | PRN
  Administered 2015-11-08: 4 mg via INTRAVENOUS

## 2015-11-08 MED ORDER — GLYCOPYRROLATE 0.2 MG/ML IJ SOLN
INTRAMUSCULAR | Status: DC | PRN
Start: 2015-11-08 — End: 2015-11-08
  Administered 2015-11-08: 0.1 mg via INTRAVENOUS

## 2015-11-08 MED ORDER — DEXAMETHASONE SODIUM PHOSPHATE 4 MG/ML IJ SOLN
INTRAMUSCULAR | Status: DC | PRN
  Administered 2015-11-08: 12 mg via INTRAVENOUS

## 2015-11-08 MED ORDER — ACETAMINOPHEN 10 MG/ML IV SOLN
1000.0000 mg | Freq: Four times a day (QID) | INTRAVENOUS | Status: DC
  Administered 2015-11-08: 1000 mg via INTRAVENOUS

## 2015-11-08 MED ORDER — SUCCINYLCHOLINE CHLORIDE 20 MG/ML IJ SOLN
INTRAMUSCULAR | Status: DC | PRN
  Administered 2015-11-08: 100 mg via INTRAVENOUS

## 2015-11-08 MED ORDER — LIDOCAINE HCL (CARDIAC) 20 MG/ML IV SOLN
INTRAVENOUS | Status: DC | PRN
  Administered 2015-11-08: 50 mg via INTRAVENOUS

## 2015-11-08 SURGICAL SUPPLY — 12 items
CANISTER SUCT 1200ML W/VALVE (MISCELLANEOUS) ×3 IMPLANT
ELECT CAUTERY BLADE TIP 2.5 (TIP) ×3
ELECTRODE CAUTERY BLDE TIP 2.5 (TIP) ×1 IMPLANT
GLOVE PI ULTRA LF STRL 7.5 (GLOVE) ×2 IMPLANT
GLOVE PI ULTRA NON LATEX 7.5 (GLOVE) ×4
KIT ROOM TURNOVER OR (KITS) IMPLANT
PACK TONSIL/ADENOIDS (PACKS) ×3 IMPLANT
PAD GROUND ADULT SPLIT (MISCELLANEOUS) ×3 IMPLANT
PENCIL ELECTRO HAND CTR (MISCELLANEOUS) ×3 IMPLANT
SOL ANTI-FOG 6CC FOG-OUT (MISCELLANEOUS) ×1 IMPLANT
SOL FOG-OUT ANTI-FOG 6CC (MISCELLANEOUS) ×2
STRAP BODY AND KNEE 60X3 (MISCELLANEOUS) ×3 IMPLANT

## 2015-11-08 NOTE — Anesthesia Procedure Notes (Signed)
Procedure Name: Intubation Date/Time: 11/08/2015 10:28 AM Performed by: Londell Moh Pre-anesthesia Checklist: Patient identified, Emergency Drugs available, Suction available, Patient being monitored and Timeout performed Patient Re-evaluated:Patient Re-evaluated prior to inductionOxygen Delivery Method: Circle system utilized Preoxygenation: Pre-oxygenation with 100% oxygen Intubation Type: IV induction Ventilation: Mask ventilation without difficulty Laryngoscope Size: Mac and 3 Grade View: Grade I Tube type: Oral Rae Tube size: 7.5 mm Number of attempts: 1 Airway Equipment and Method: Stylet Placement Confirmation: ETT inserted through vocal cords under direct vision,  positive ETCO2 and breath sounds checked- equal and bilateral Tube secured with: Tape Dental Injury: Teeth and Oropharynx as per pre-operative assessment

## 2015-11-08 NOTE — H&P (Signed)
  H&P has been reviewed and no changes necessary. To be downloaded later. 

## 2015-11-08 NOTE — Transfer of Care (Signed)
Immediate Anesthesia Transfer of Care Note  Patient: Shawn Wiggins  Procedure(s) Performed: Procedure(s): TONSILLECTOMY (N/A)  Patient Location: PACU  Anesthesia Type: General ETT  Level of Consciousness: awake, alert  and patient cooperative  Airway and Oxygen Therapy: Patient Spontanous Breathing and Patient connected to supplemental oxygen  Post-op Assessment: Post-op Vital signs reviewed, Patient's Cardiovascular Status Stable, Respiratory Function Stable, Patent Airway and No signs of Nausea or vomiting  Post-op Vital Signs: Reviewed and stable  Complications: No apparent anesthesia complications

## 2015-11-08 NOTE — Op Note (Signed)
11/08/2015  10:50 AM    Shawn Wiggins  AO:[id number]   Pre-Op Dx:  Chronic tonsillitis with cryptic tonsils  Post-op Dx: Chronic tonsillitis with cryptic tonsils  Proc: Tonsillectomy   Surg:  Shawn Wiggins  Anes:  GOT  EBL:  30 mL  Comp:  None  Findings:  Patient had small tonsils that were hidden behind the anterior tonsillar pillar. There was lots of debris hiding behind the tonsillar pillars on both sides.  Procedure: The patient was brought to the operating room and placed in a supine position. He was given general anesthesia by oral endotracheal intubation. A Davis mouth gag was used to visualize the oropharynx. The tonsils were small and hidden behind the anterior tonsillar pillars. As I lifted up the anterior tonsillar pillar you could see debris hiding in the tonsillar flosses on both sides. The tonsil was grasped and pulled medially and the anterior pillar was incised using electrocautery. The tonsil was dissected from its fossa using blunt dissection and electrocautery. It was very scarred down. Once the tonsil was removed, bleeding was controlled with direct pressure and electrocautery. This was repeated on both sides. There was bleeding vessels at the inferior tonsillar pole that were cauterized to control bleeding there.  The patient tolerated the procedure well. He was awakened taken to the recovery room in satisfactory condition area there were no operative complications.  Dispo:   To PACU to be discharged home  Plan:  To follow-up in the office in 2-3 weeks to make sure use healed well. He'll push fluids at home and use hydrocodone and Tylenol liquid for pain.  Shawn Wiggins  11/08/2015 10:50 AM

## 2015-11-08 NOTE — Anesthesia Postprocedure Evaluation (Signed)
Anesthesia Post Note  Patient: Shawn Wiggins  Procedure(s) Performed: Procedure(s) (LRB): TONSILLECTOMY (N/A)  Patient location during evaluation: PACU Anesthesia Type: General Level of consciousness: awake and alert Pain management: pain level controlled Vital Signs Assessment: post-procedure vital signs reviewed and stable Respiratory status: spontaneous breathing and respiratory function stable Cardiovascular status: stable Postop Assessment: adequate PO intake, no signs of nausea or vomiting and no headache Anesthetic complications: no    Camdin Hegner, III,  Valentine Barney D

## 2015-11-08 NOTE — Anesthesia Preprocedure Evaluation (Signed)
Anesthesia Evaluation  Patient identified by MRN, date of birth, ID band Patient awake    Reviewed: Allergy & Precautions, H&P , NPO status , Patient's Chart, lab work & pertinent test results  Airway Mallampati: II  TM Distance: >3 FB Neck ROM: full    Dental no notable dental hx.    Pulmonary Current Smoker,    Pulmonary exam normal        Cardiovascular negative cardio ROS Normal cardiovascular exam     Neuro/Psych negative neurological ROS     GI/Hepatic negative GI ROS, Neg liver ROS,   Endo/Other  negative endocrine ROS  Renal/GU      Musculoskeletal   Abdominal   Peds  Hematology negative hematology ROS (+)   Anesthesia Other Findings   Reproductive/Obstetrics                             Anesthesia Physical Anesthesia Plan  ASA: II  Anesthesia Plan: General ETT   Post-op Pain Management:    Induction:   Airway Management Planned:   Additional Equipment:   Intra-op Plan:   Post-operative Plan:   Informed Consent: I have reviewed the patients History and Physical, chart, labs and discussed the procedure including the risks, benefits and alternatives for the proposed anesthesia with the patient or authorized representative who has indicated his/her understanding and acceptance.     Plan Discussed with: CRNA  Anesthesia Plan Comments:         Anesthesia Quick Evaluation

## 2015-11-12 LAB — SURGICAL PATHOLOGY

## 2019-08-04 ENCOUNTER — Other Ambulatory Visit: Payer: Self-pay

## 2019-08-04 ENCOUNTER — Telehealth: Payer: Self-pay

## 2019-08-04 DIAGNOSIS — Z86010 Personal history of colonic polyps: Secondary | ICD-10-CM

## 2019-08-04 DIAGNOSIS — Z1211 Encounter for screening for malignant neoplasm of colon: Secondary | ICD-10-CM

## 2019-08-04 MED ORDER — GOLYTELY 236 G PO SOLR: 4000.0000 mL | Freq: Once | ORAL | 0 refills | Status: AC

## 2019-08-04 NOTE — Telephone Encounter (Signed)
Gastroenterology Pre-Procedure Review    PATIENT REVIEW QUESTIONS: The patient responded to the following health history questions as indicated:    1. Are you having any GI issues? no 2. Do you have a personal history of Polyps? Yes over 5 years ago  3. Do you have a family history of Colon Cancer or Polyps? no 4. Diabetes Mellitus? no 5. Joint replacements in the past 12 months?no 6. Major health problems in the past 3 months?no 7. Any artificial heart valves, MVP, or defibrillator?no    MEDICATIONS & ALLERGIES:    Patient reports the following regarding taking any anticoagulation/antiplatelet therapy:   Plavix, Coumadin, Eliquis, Xarelto, Lovenox, Pradaxa, Brilinta, or Effient? no Aspirin? no  Patient confirms/reports the following medications:  Current Outpatient Medications  Medication Sig Dispense Refill  . fluticasone (FLONASE) 50 MCG/ACT nasal spray Place 1 spray into both nostrils daily. PM     No current facility-administered medications for this visit.    Patient confirms/reports the following allergies:  Allergies  Allergen Reactions  . Demerol [Meperidine] Other (See Comments)    HEADACHE    No orders of the defined types were placed in this encounter.   AUTHORIZATION INFORMATION Primary Insurance: 1D#: Group #:  Secondary Insurance: 1D#: Group #:  SCHEDULE INFORMATION: Date: 09/16/2019 Time: Location:[facility]

## 2019-08-16 ENCOUNTER — Telehealth: Payer: Self-pay

## 2019-08-16 NOTE — Telephone Encounter (Signed)
Called patient and moved patient procedure to March 5th in [place]. Mailed instructions to patient. Got patient moved to [facility] by kim

## 2019-09-22 ENCOUNTER — Other Ambulatory Visit: Payer: Self-pay

## 2019-09-22 ENCOUNTER — Encounter: Payer: Self-pay | Admitting: Gastroenterology

## 2019-09-27 NOTE — Discharge Instructions (Signed)
General Anesthesia, Adult, Care After This sheet gives you information about how to care for yourself after your procedure. Your health care provider may also give you more specific instructions. If you have problems or questions, contact your health care provider. What can I expect after the procedure? After the procedure, the following side effects are common:  Pain or discomfort at the IV site.  Nausea.  Vomiting.  Sore throat.  Trouble concentrating.  Feeling cold or chills.  Weak or tired.  Sleepiness and fatigue.  Soreness and body aches. These side effects can affect parts of the body that were not involved in surgery. Follow these instructions at home:  For at least 24 hours after the procedure:  Have a responsible adult stay with you. It is important to have someone help care for you until you are awake and alert.  Rest as needed.  Do not: ? Participate in activities in which you could fall or become injured. ? Drive. ? Use heavy machinery. ? Drink alcohol. ? Take sleeping pills or medicines that cause drowsiness. ? Make important decisions or sign legal documents. ? Take care of children on your own. Eating and drinking  Follow any instructions from your health care provider about eating or drinking restrictions.  When you feel hungry, start by eating small amounts of foods that are soft and easy to digest (bland), such as toast. Gradually return to your regular diet.  Drink enough fluid to keep your urine pale yellow.  If you vomit, rehydrate by drinking water, juice, or clear broth. General instructions  If you have sleep apnea, surgery and certain medicines can increase your risk for breathing problems. Follow instructions from your health care provider about wearing your sleep device: ? Anytime you are sleeping, including during daytime naps. ? While taking prescription pain medicines, sleeping medicines, or medicines that make you drowsy.  Return to  your normal activities as told by your health care provider. Ask your health care provider what activities are safe for you.  Take over-the-counter and prescription medicines only as told by your health care provider.  If you smoke, do not smoke without supervision.  Keep all follow-up visits as told by your health care provider. This is important. Contact a health care provider if:  You have nausea or vomiting that does not get better with medicine.  You cannot eat or drink without vomiting.  You have pain that does not get better with medicine.  You are unable to pass urine.  You develop a skin rash.  You have a fever.  You have redness around your IV site that gets worse. Get help right away if:  You have difficulty breathing.  You have chest pain.  You have blood in your urine or stool, or you vomit blood. Summary  After the procedure, it is common to have a sore throat or nausea. It is also common to feel tired.  Have a responsible adult stay with you for the first 24 hours after general anesthesia. It is important to have someone help care for you until you are awake and alert.  When you feel hungry, start by eating small amounts of foods that are soft and easy to digest (bland), such as toast. Gradually return to your regular diet.  Drink enough fluid to keep your urine pale yellow.  Return to your normal activities as told by your health care provider. Ask your health care provider what activities are safe for you. This information is not   intended to replace advice given to you by your health care provider. Make sure you discuss any questions you have with your health care provider. Document Revised: 07/17/2017 Document Reviewed: 02/27/2017 Elsevier Patient Education  2020 Elsevier Inc.  

## 2019-09-28 ENCOUNTER — Other Ambulatory Visit
Admission: RE | Admit: 2019-09-28 | Discharge: 2019-09-28 | Disposition: A | Discharge status: Home / Self Care | Payer: Federal, State, Local not specified - PPO | Source: Ambulatory Visit | Attending: Gastroenterology | Admitting: Gastroenterology

## 2019-09-28 ENCOUNTER — Other Ambulatory Visit: Payer: Self-pay

## 2019-09-28 DIAGNOSIS — Z20822 Contact with and (suspected) exposure to covid-19: Secondary | ICD-10-CM | POA: Insufficient documentation

## 2019-09-28 DIAGNOSIS — Z01812 Encounter for preprocedural laboratory examination: Secondary | ICD-10-CM | POA: Insufficient documentation

## 2019-09-28 LAB — SARS CORONAVIRUS 2 (TAT 6-24 HRS): SARS Coronavirus 2: NEGATIVE

## 2019-09-30 ENCOUNTER — Other Ambulatory Visit: Payer: Self-pay

## 2019-09-30 ENCOUNTER — Ambulatory Visit
Admission: RE | Admit: 2019-09-30 | Discharge: 2019-09-30 | Disposition: A | Discharge status: Home / Self Care | Payer: Federal, State, Local not specified - PPO | Attending: Gastroenterology | Admitting: Gastroenterology

## 2019-09-30 ENCOUNTER — Ambulatory Visit: Payer: Federal, State, Local not specified - PPO | Admitting: Anesthesiology

## 2019-09-30 ENCOUNTER — Encounter
Admission: RE | Disposition: A | Discharge status: Home / Self Care | Payer: Self-pay | Source: Home / Self Care | Attending: Gastroenterology

## 2019-09-30 ENCOUNTER — Encounter: Payer: Self-pay | Admitting: Gastroenterology

## 2019-09-30 DIAGNOSIS — Z09 Encounter for follow-up examination after completed treatment for conditions other than malignant neoplasm: Secondary | ICD-10-CM | POA: Diagnosis present

## 2019-09-30 DIAGNOSIS — Z8673 Personal history of transient ischemic attack (TIA), and cerebral infarction without residual deficits: Secondary | ICD-10-CM | POA: Insufficient documentation

## 2019-09-30 DIAGNOSIS — F17210 Nicotine dependence, cigarettes, uncomplicated: Secondary | ICD-10-CM | POA: Insufficient documentation

## 2019-09-30 DIAGNOSIS — K5730 Diverticulosis of large intestine without perforation or abscess without bleeding: Secondary | ICD-10-CM | POA: Insufficient documentation

## 2019-09-30 DIAGNOSIS — Z860100 Personal history of colon polyps, unspecified: Secondary | ICD-10-CM

## 2019-09-30 DIAGNOSIS — M159 Polyosteoarthritis, unspecified: Secondary | ICD-10-CM | POA: Insufficient documentation

## 2019-09-30 DIAGNOSIS — D124 Benign neoplasm of descending colon: Secondary | ICD-10-CM | POA: Diagnosis not present

## 2019-09-30 DIAGNOSIS — K635 Polyp of colon: Secondary | ICD-10-CM | POA: Diagnosis not present

## 2019-09-30 HISTORY — PX: COLONOSCOPY WITH PROPOFOL: SHX5780

## 2019-09-30 HISTORY — PX: POLYPECTOMY: SHX5525

## 2019-09-30 SURGERY — COLONOSCOPY WITH PROPOFOL
Anesthesia: General | Site: Rectum

## 2019-09-30 MED ORDER — GLYCOPYRROLATE 0.2 MG/ML IJ SOLN
INTRAMUSCULAR | Status: DC | PRN
  Administered 2019-09-30: .2 mg via INTRAVENOUS

## 2019-09-30 MED ORDER — STERILE WATER FOR IRRIGATION IR SOLN
Status: DC | PRN
  Administered 2019-09-30: .05 mL

## 2019-09-30 MED ORDER — LACTATED RINGERS IV SOLN: INTRAVENOUS | Status: DC

## 2019-09-30 MED ORDER — LIDOCAINE HCL (CARDIAC) PF 100 MG/5ML IV SOSY
PREFILLED_SYRINGE | INTRAVENOUS | Status: DC | PRN
  Administered 2019-09-30: 40 mg via INTRAVENOUS

## 2019-09-30 MED ORDER — PROPOFOL 10 MG/ML IV BOLUS
INTRAVENOUS | Status: DC | PRN
  Administered 2019-09-30: 50 mg via INTRAVENOUS
  Administered 2019-09-30: 30 mg via INTRAVENOUS
  Administered 2019-09-30: 80 mg via INTRAVENOUS
  Administered 2019-09-30: 50 mg via INTRAVENOUS

## 2019-09-30 SURGICAL SUPPLY — 7 items
CANISTER SUCT 1200ML W/VALVE (MISCELLANEOUS) ×4 IMPLANT
GOWN CVR UNV OPN BCK APRN NK (MISCELLANEOUS) ×4 IMPLANT
GOWN ISOL THUMB LOOP REG UNIV (MISCELLANEOUS) ×8
KIT ENDO PROCEDURE OLY (KITS) ×4 IMPLANT
SNARE COLD EXACTO (MISCELLANEOUS) ×4 IMPLANT
TRAP ETRAP POLY (MISCELLANEOUS) ×4 IMPLANT
WATER STERILE IRR 250ML POUR (IV SOLUTION) ×4 IMPLANT

## 2019-09-30 NOTE — H&P (Addendum)
Shawn Darby, MD [address]  [place]  [place],  [address]  Main: [phone]  Fax: [phone] Pager: [phone]  Primary Care Physician:  Valera Castle, MD Primary Gastroenterologist:  Dr. Cephas Wiggins  Pre-Procedure History & Physical: HPI:  Shawn Wiggins is a 59 y.o. male is here for an colonoscopy.   Past Medical History:  Diagnosis Date  . AC (acromioclavicular) joint bone spurs   . Arthritis    ALL OVER  . Herpes genitalis   . Kidney stones   . Mini stroke ([place])   . Neck pain    POSSIBLE BONE SPURS  . Tonsillitis, chronic     Past Surgical History:  Procedure Laterality Date  . COLONOSCOPY WITH PROPOFOL N/A 03/22/2015   Procedure: COLONOSCOPY WITH PROPOFOL;  Surgeon: Josefine Class, MD;  Location: [facility] ENDOSCOPY;  Service: Endoscopy;  Laterality: N/A;  . HERNIA REPAIR    . TONSILLECTOMY N/A 11/08/2015   Procedure: TONSILLECTOMY;  Surgeon: Margaretha Sheffield, MD;  Location: [facility];  Service: ENT;  Laterality: N/A;  . WISDOM TOOTH EXTRACTION      Prior to Admission medications   Medication Sig Start Date End Date Taking? Authorizing Provider  cetirizine (ZYRTEC) 10 MG tablet Take 10 mg by mouth daily.   Yes [provider]    Allergies as of 08/04/2019 - Review Complete 11/08/2015  Allergen Reaction Noted  . Demerol [meperidine] Other (See Comments) 03/21/2015    History reviewed. No pertinent family history.  Social History   Socioeconomic History  . Marital status: Divorced    Spouse name: Not on file  . Number of children: Not on file  . Years of education: Not on file  . Highest education level: Not on file  Occupational History  . Not on file  Tobacco Use  . Smoking status: Current Every Day Smoker    Packs/day: 1.00    Years: 30.00    Pack years: 30.00    Types: Cigarettes  . Smokeless tobacco: Never Used  Substance and Sexual Activity  . Alcohol use: No    Comment: OCCASIONAL  . Drug  use: No    Types: Marijuana    Comment: PAST  . Sexual activity: Not on file  Other Topics Concern  . Not on file  Social History Narrative  . Not on file   Social Determinants of Health   Financial Resource Strain:   . Difficulty of Paying Living Expenses: Not on file  Food Insecurity:   . Worried About Charity fundraiser in the Last Year: Not on file  . Ran Out of Food in the Last Year: Not on file  Transportation Needs:   . Lack of Transportation (Medical): Not on file  . Lack of Transportation (Non-Medical): Not on file  Physical Activity:   . Days of Exercise per Week: Not on file  . Minutes of Exercise per Session: Not on file  Stress:   . Feeling of Stress : Not on file  Social Connections:   . Frequency of Communication with Friends and Family: Not on file  . Frequency of Social Gatherings with Friends and Family: Not on file  . Attends Religious Services: Not on file  . Active Member of Clubs or Organizations: Not on file  . Attends Archivist Meetings: Not on file  . Marital Status: Not on file  Intimate Partner Violence:   . Fear of Current or Ex-Partner: Not on file  . Emotionally  Abused: Not on file  . Physically Abused: Not on file  . Sexually Abused: Not on file    Review of Systems: See HPI, otherwise negative ROS  Physical Exam: BP 113/68   Pulse 69   Temp (!) 97.3 F (36.3 C) (Temporal)   Resp 16   Ht 5\' 9"  (1.753 m)   Wt 93 kg   SpO2 97%   BMI 30.28 kg/m  General:   Alert,  pleasant and cooperative in NAD Head:  Normocephalic and atraumatic. Neck:  Supple; no masses or thyromegaly. Lungs:  Clear throughout to auscultation.    Heart:  Regular rate and rhythm. Abdomen:  Soft, nontender and nondistended. Normal bowel sounds, without guarding, and without rebound.   Neurologic:  Alert and  oriented x4;  grossly normal neurologically.  Impression/Plan: Shawn Wiggins is here for an colonoscopy to be performed for personal h/o  tubular adenomas  Risks, benefits, limitations, and alternatives regarding colonoscopy have been reviewed with the patient.  Questions have been answered.  All parties agreeable.   Sherri Sear, MD  09/30/2019, 9:56 AM

## 2019-09-30 NOTE — Anesthesia Preprocedure Evaluation (Signed)
Anesthesia Evaluation  Patient identified by MRN, date of birth, ID band Patient awake    History of Anesthesia Complications Negative for: history of anesthetic complications  Airway Mallampati: II  TM Distance: >3 FB Neck ROM: Full    Dental no notable dental hx.    Pulmonary Current Smoker (1ppd, 2 cigarettes today)Patient did not abstain from smoking.,    Pulmonary exam normal        Cardiovascular Exercise Tolerance: Good negative cardio ROS Normal cardiovascular exam     Neuro/Psych Patient reports having a stroke "a long time ago", no residual deficits, reports having extensive workup without revealing findings negative psych ROS   GI/Hepatic negative GI ROS, Neg liver ROS,   Endo/Other  negative endocrine ROS  Renal/GU negative Renal ROS     Musculoskeletal   Abdominal   Peds  Hematology   Anesthesia Other Findings   Reproductive/Obstetrics                             Anesthesia Physical Anesthesia Plan  ASA: II  Anesthesia Plan: General   Post-op Pain Management:    Induction: Intravenous  PONV Risk Score and Plan: 1 and Propofol infusion, TIVA and Treatment may vary due to age or medical condition  Airway Management Planned: Nasal Cannula  Additional Equipment: None  Intra-op Plan:   Post-operative Plan:   Informed Consent: I have reviewed the patients History and Physical, chart, labs and discussed the procedure including the risks, benefits and alternatives for the proposed anesthesia with the patient or authorized representative who has indicated his/her understanding and acceptance.       Plan Discussed with: CRNA  Anesthesia Plan Comments:         Anesthesia Quick Evaluation

## 2019-09-30 NOTE — Op Note (Signed)
[facility] Patient Name: Shawn Wiggins Procedure Date: 09/30/2019 10:37 AM MRN: [id number] Account #: [id number] Date of Birth: Jul 03, 1961 Admit Type: Outpatient Age: 59 Room: [facility] OR ROOM 01 Gender: Male Note Status: Finalized Procedure:             Colonoscopy Indications:           Surveillance: Personal history of adenomatous polyps                         on last colonoscopy 5 years ago, Last colonoscopy:                         August 2016 Providers:             Lin Landsman MD, MD Referring MD:          Wynona Canes. Kym Groom, MD (Referring MD) Medicines:             Monitored Anesthesia Care Complications:         No immediate complications. Estimated blood loss: None. Procedure:             Pre-Anesthesia Assessment:                        - Prior to the procedure, a History and Physical was                         performed, and patient medications and allergies were                         reviewed. The patient is competent. The risks and                         benefits of the procedure and the sedation options and                         risks were discussed with the patient. All questions                         were answered and informed consent was obtained.                         Patient identification and proposed procedure were                         verified by the physician, the nurse, the                         anesthesiologist, the anesthetist and the technician                         in the pre-procedure area in the procedure room in the                         endoscopy suite. Mental Status Examination: alert and                         oriented. Airway Examination: normal oropharyngeal  airway and neck mobility. Respiratory Examination:                         clear to auscultation. CV Examination: normal.                         Prophylactic Antibiotics: The patient does not require              prophylactic antibiotics. Prior Anticoagulants: The                         patient has taken no previous anticoagulant or                         antiplatelet agents. ASA Grade Assessment: III - A                         patient with severe systemic disease. After reviewing                         the risks and benefits, the patient was deemed in                         satisfactory condition to undergo the procedure. The                         anesthesia plan was to use monitored anesthesia care                         (MAC). Immediately prior to administration of                         medications, the patient was re-assessed for adequacy                         to receive sedatives. The heart rate, respiratory                         rate, oxygen saturations, blood pressure, adequacy of                         pulmonary ventilation, and response to care were                         monitored throughout the procedure. The physical                         status of the patient was re-assessed after the                         procedure.                        After obtaining informed consent, the colonoscope was                         passed under direct vision. Throughout the procedure,  the patient's blood pressure, pulse, and oxygen                         saturations were monitored continuously. The                         Colonoscope was introduced through the anus and                         advanced to the the cecum, identified by appendiceal                         orifice and ileocecal valve. The colonoscopy was                         performed with moderate difficulty due to the                         patient's oxygen desaturation. Successful completion                         of the procedure was aided by performing chin lift and                         administering oxygen. The patient tolerated the                         procedure  fairly well. The quality of the bowel                         preparation was evaluated using the BBPS [facility] Bowel                         Preparation Scale) with scores of: Right Colon = 3,                         Transverse Colon = 3 and Left Colon = 3 (entire mucosa                         seen well with no residual staining, small fragments                         of stool or opaque liquid). The total BBPS score                         equals 9. Findings:      The perianal and digital rectal examinations were normal. Pertinent       negatives include normal sphincter tone and no palpable rectal lesions.      A 5 mm polyp was found in the descending colon. The polyp was sessile.       The polyp was removed with a cold snare. Resection and retrieval were       complete.      Multiple diverticula were found in the sigmoid colon.      The retroflexed view of the distal rectum and anal verge was normal and       showed no anal or rectal abnormalities. Impression:            -  One 5 mm polyp in the descending colon, removed with                         a cold snare. Resected and retrieved.                        - Diverticulosis in the sigmoid colon.                        - The distal rectum and anal verge are normal on                         retroflexion view. Recommendation:        - Discharge patient to home (with escort).                        - Resume previous diet today.                        - Continue present medications.                        - Await pathology results.                        - Repeat colonoscopy in 5 years for surveillance. Procedure Code(s):     --- Professional ---                        [phone], Colonoscopy, flexible; with removal of                         tumor(s), polyp(s), or other lesion(s) by snare                         technique Diagnosis Code(s):     --- Professional ---                        Z86.010, Personal history of colonic polyps                         K63.5, Polyp of colon                        K57.30, Diverticulosis of large intestine without                         perforation or abscess without bleeding CPT copyright 2019 [employer]. All rights reserved. The codes documented in this report are preliminary and upon coder review may  be revised to meet current compliance requirements. Dr. Ulyess Mort Lin Landsman MD, MD 09/30/2019 11:05:28 AM This report has been signed electronically. Number of Addenda: 0 Note Initiated On: 09/30/2019 10:37 AM Scope Withdrawal Time: 0 hours 9 minutes 13 seconds  Total Procedure Duration: 0 hours 12 minutes 1 second  Estimated Blood Loss:  Estimated blood loss: none.      [facility]

## 2019-09-30 NOTE — Transfer of Care (Signed)
Immediate Anesthesia Transfer of Care Note  Patient: Shawn Wiggins  Procedure(s) Performed: COLONOSCOPY WITH PROPOFOL (N/A Rectum) POLYPECTOMY (Rectum)  Patient Location: PACU  Anesthesia Type: General  Level of Consciousness: awake, alert  and patient cooperative  Airway and Oxygen Therapy: Patient Spontanous Breathing and Patient connected to supplemental oxygen  Post-op Assessment: Post-op Vital signs reviewed, Patient's Cardiovascular Status Stable, Respiratory Function Stable, Patent Airway and No signs of Nausea or vomiting  Post-op Vital Signs: Reviewed and stable  Complications: No apparent anesthesia complications

## 2019-09-30 NOTE — Anesthesia Postprocedure Evaluation (Signed)
Anesthesia Post Note  Patient: Shawn Wiggins  Procedure(s) Performed: COLONOSCOPY WITH PROPOFOL (N/A Rectum) POLYPECTOMY (Rectum)     Patient location during evaluation: PACU Anesthesia Type: General Level of consciousness: awake and alert Pain management: pain level controlled Vital Signs Assessment: post-procedure vital signs reviewed and stable Respiratory status: spontaneous breathing, nonlabored ventilation, respiratory function stable and patient connected to nasal cannula oxygen Cardiovascular status: blood pressure returned to baseline and stable Postop Assessment: no apparent nausea or vomiting Anesthetic complications: no    Adele Barthel Jessee Mezera

## 2019-10-03 ENCOUNTER — Encounter: Payer: Self-pay | Admitting: *Deleted

## 2019-10-04 ENCOUNTER — Encounter: Payer: Self-pay | Admitting: Gastroenterology

## 2019-10-04 LAB — SURGICAL PATHOLOGY
